# Patient Record
Sex: Male | Born: 1975 | Race: White | Hispanic: No | Marital: Married
Health system: Southern US, Academic
[De-identification: ages and names within clinical notes are randomized; demographics above are authoritative.]

---

## 2006-08-31 ENCOUNTER — Emergency Department (HOSPITAL_COMMUNITY): Payer: Self-pay | Admitting: EXTERNAL

## 2022-04-01 ENCOUNTER — Other Ambulatory Visit (HOSPITAL_COMMUNITY): Payer: Self-pay | Admitting: Neurological Surgery

## 2022-04-01 ENCOUNTER — Inpatient Hospital Stay
Admission: RE | Admit: 2022-04-01 | Discharge: 2022-04-01 | Disposition: A | Discharge status: Home / Self Care | Payer: Worker's Comp, Other unspecified | Source: Ambulatory Visit | Attending: Neurological Surgery | Admitting: Neurological Surgery

## 2022-04-01 ENCOUNTER — Other Ambulatory Visit: Payer: Self-pay

## 2022-04-01 DIAGNOSIS — S22009A Unspecified fracture of unspecified thoracic vertebra, initial encounter for closed fracture: Secondary | ICD-10-CM | POA: Insufficient documentation

## 2022-08-23 IMAGING — MR MRI THORACIC SPINE WITHOUT CONTRAST
4 of 5 series · 27 of 48 positions shown · non-contrast
Comparison: None available.

﻿EXAM:  [DATE]   MRI THORACIC SPINE WITHOUT CONTRAST,MRI LUMBAR SPINE WITHOUT CONTRAST
INDICATION: Low/mid back pain.
TECHNIQUE: Noncontrast multiplanar, multisequence MRI was performed.

[Series 5: T2 · sagittal · 4.0mm · 0.78mm/px · 7 of 13 slices shown (1 of 2)]
[im 1/13]
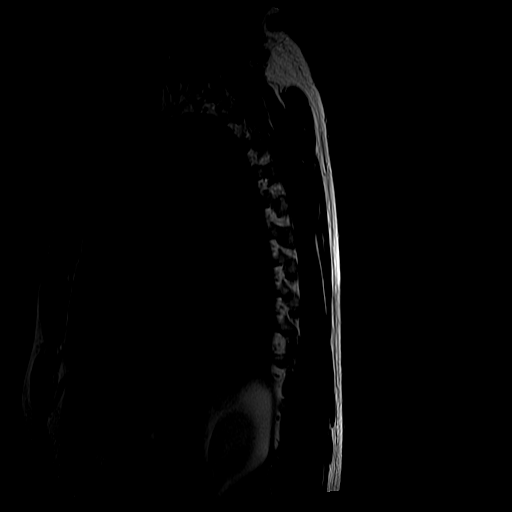
[im 3/13]
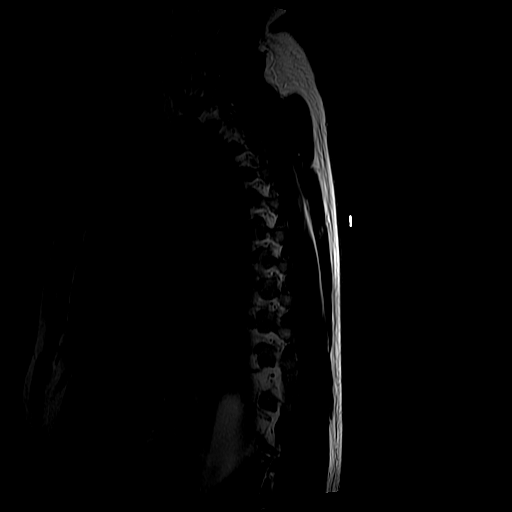
[im 5/13]
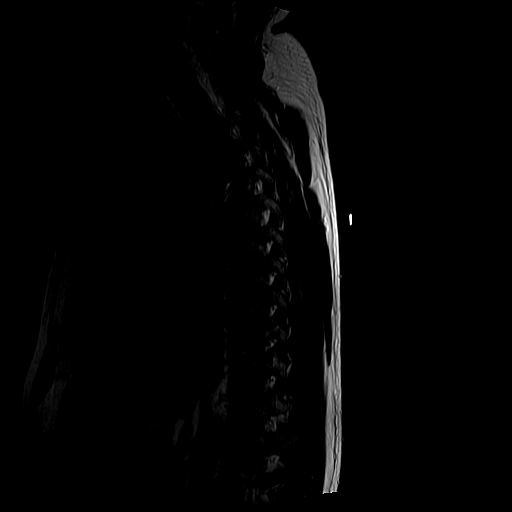
[im 7/13]
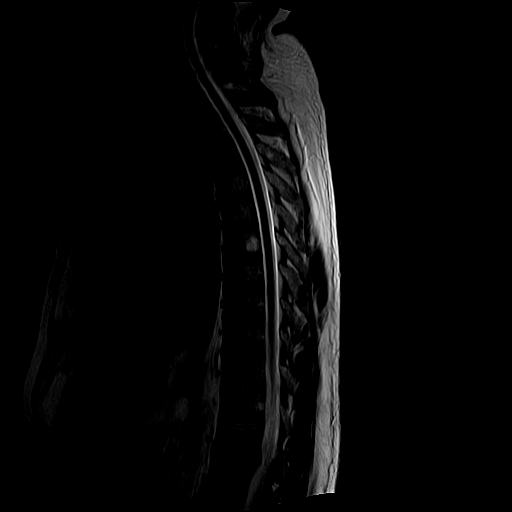
[im 9/13]
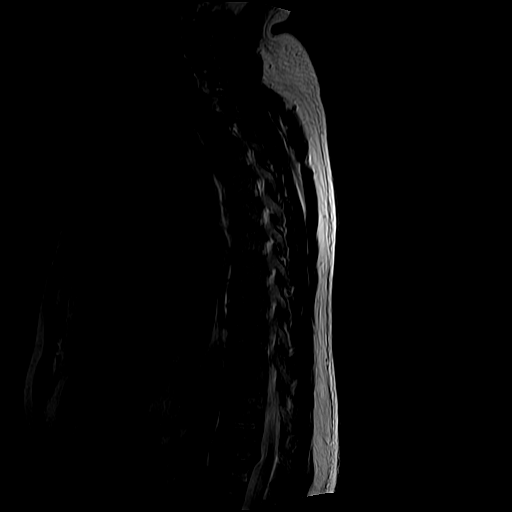
[im 11/13]
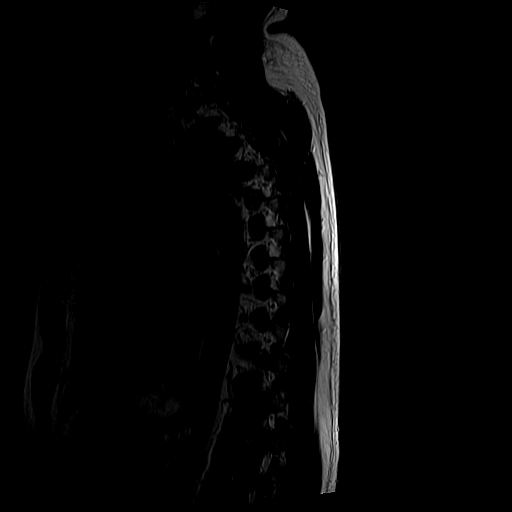
[im 13/13]
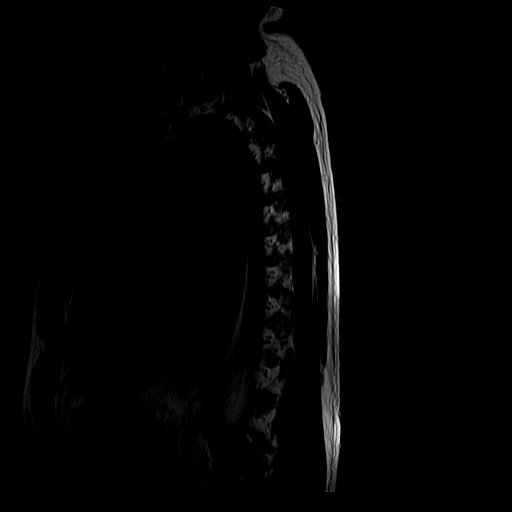

[Series 6: T1 · sagittal · 4.0mm · 0.78mm/px · 7 of 13 slices shown (1 of 2)]
[im 1/13]
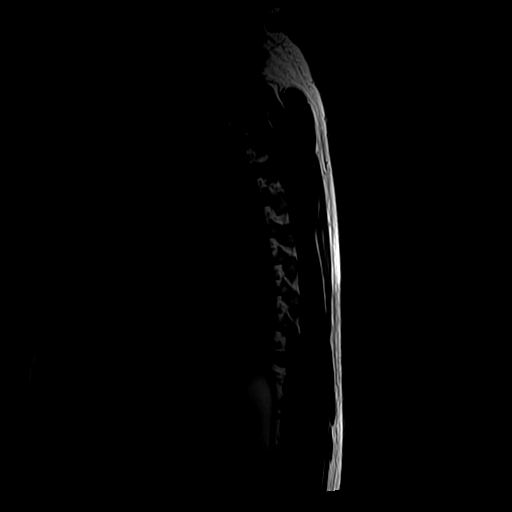
[im 3/13]
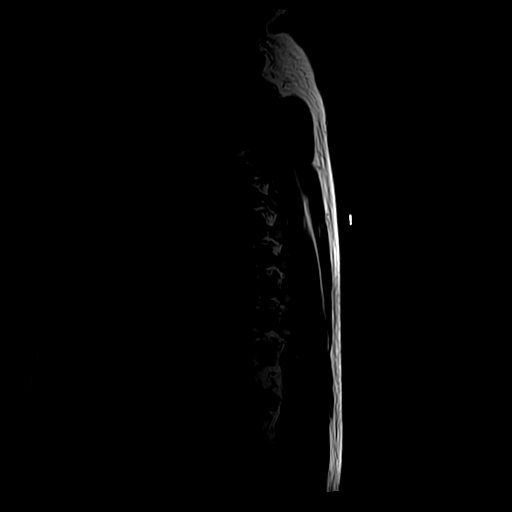
[im 5/13]
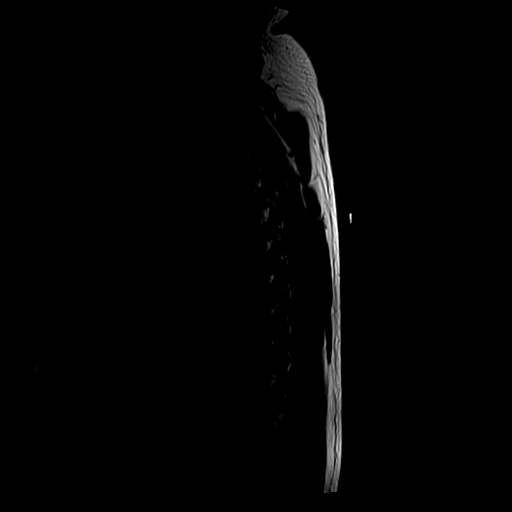
[im 7/13]
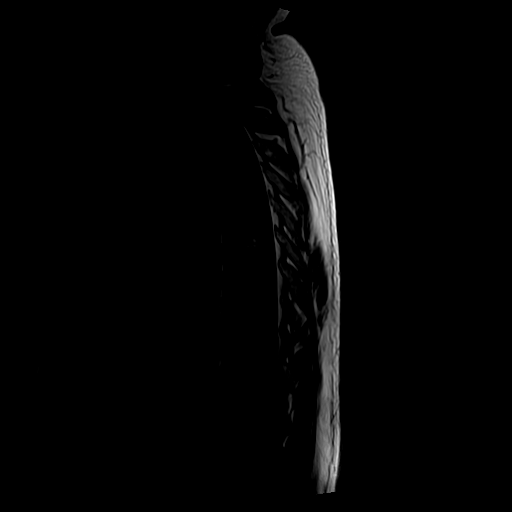
[im 9/13]
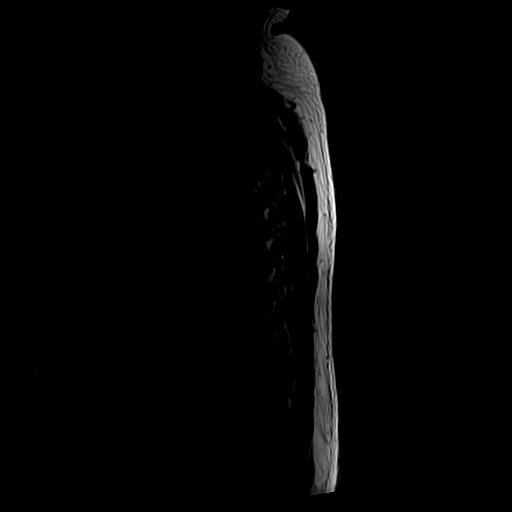
[im 11/13]
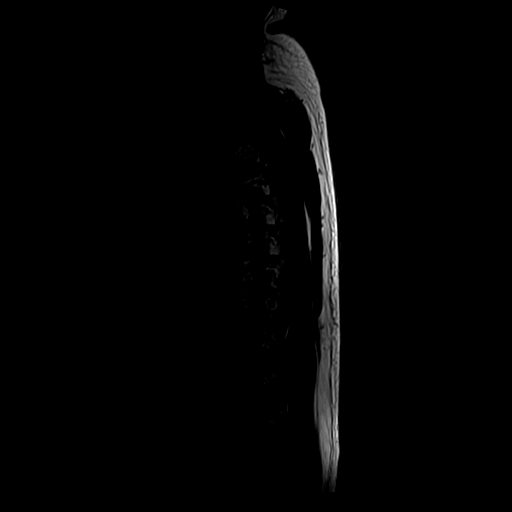
[im 13/13]
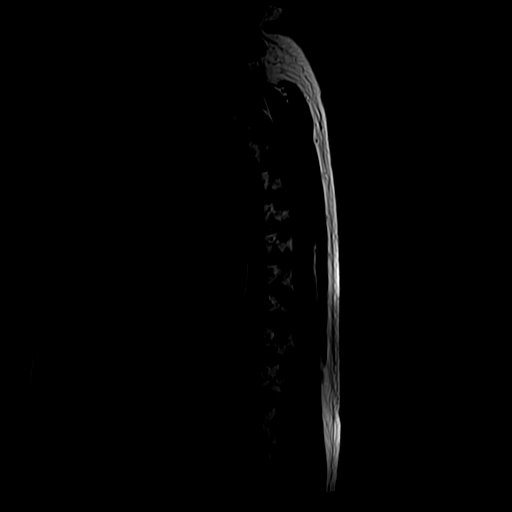

[Series 8: T2 · axial · 4.0mm · 0.62mm/px · z∈[-230,+6]mm · 9 of 22 slices shown (2 of 2)]
[im 1/22]
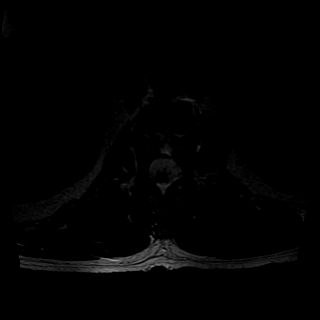
[im 4/22]
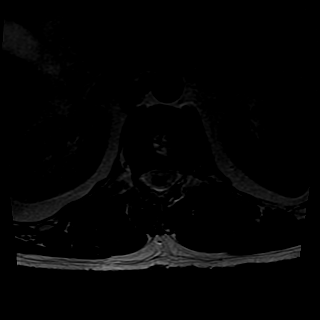
[im 8/22]
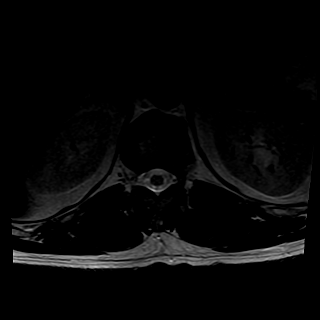
[im 9/22]
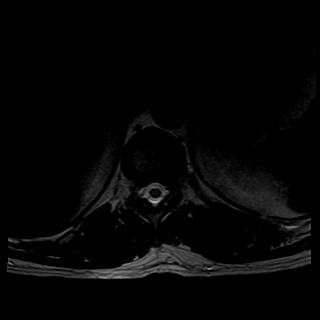
[im 11/22]
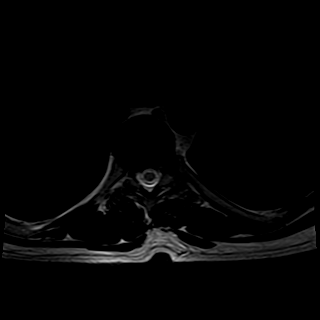
[im 13/22]
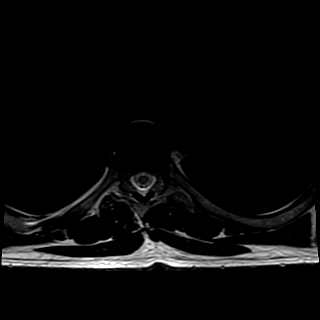
[im 15/22]
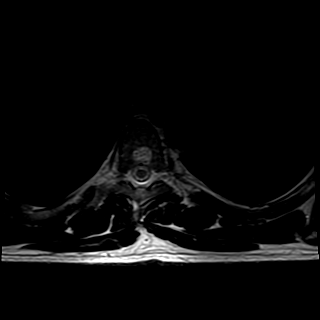
[im 18/22]
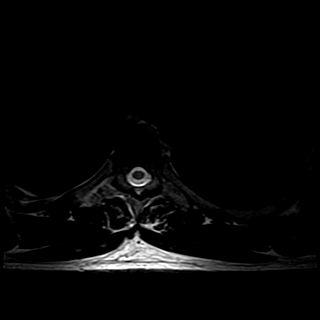
[im 22/22]
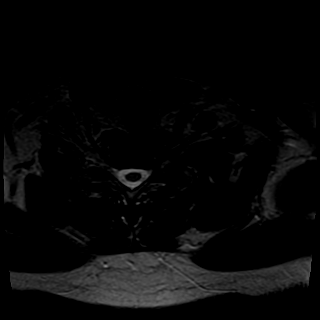

[Series 9: T1 · axial · 4.0mm · 0.62mm/px · z∈[-230,-38]mm · 4 of 22 slices shown (2 of 2)]
[im 1/22]
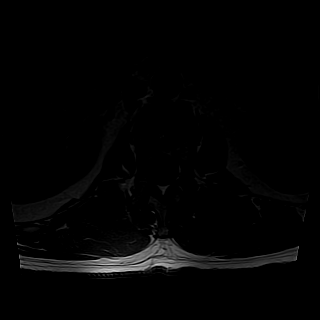
[im 4/22]
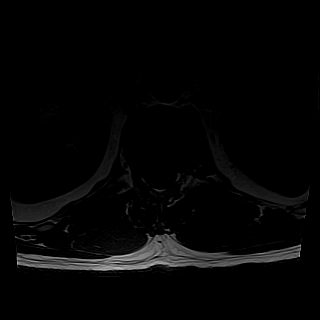
[im 11/22]
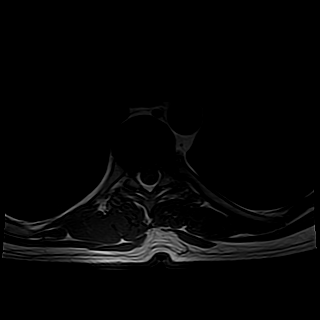
[im 18/22]
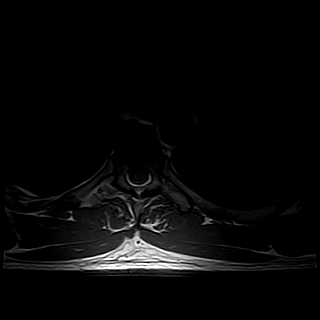

[27 of 48 positions shown; findings below may reference images not displayed]

FINDINGS: THORACIC SPINE:  There is an old T12 compression fracture.  There is mild retropulsion with slight compression of the thecal sac but no apparent spinal cord compression. 

The remaining visualized thoracic spine appears unremarkable.  A small hemangioma is noted within the T6 vertebral body.  There is no acute fracture, malalignment, significant marrow signal alteration, disc herniation, spinal stenosis, or abnormality of the spinal cord.  

LUMBAR SPINE:  There is mild degenerative disc disease at L4-L5 with mild disc bulging.  

The remaining visualized lumbar spine appears unremarkable.  There is no acute fracture, malalignment, significant marrow signal alteration, disc herniation, significant spinal stenosis, or abnormality of the conus.
IMPRESSION: 1. Old T12 compression fracture. 

2. Mild degenerative disc disease with mild disc bulging at L4-L5.

## 2022-08-23 IMAGING — MR MRI LUMBAR SPINE WITHOUT CONTRAST
5 of 6 series · 32 of 48 positions shown · non-contrast
Comparison: None available.

﻿EXAM:  [DATE]   MRI THORACIC SPINE WITHOUT CONTRAST,MRI LUMBAR SPINE WITHOUT CONTRAST
INDICATION: Low/mid back pain.
TECHNIQUE: Noncontrast multiplanar, multisequence MRI was performed.

[Series 5: T2 · sagittal · 4.0mm · 0.94mm/px · 6 of 13 slices shown (1 of 3)]
[im 1/13]
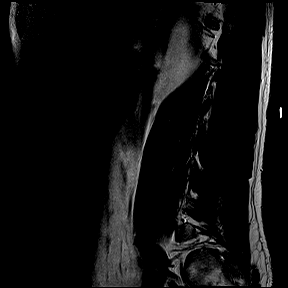
[im 3/13]
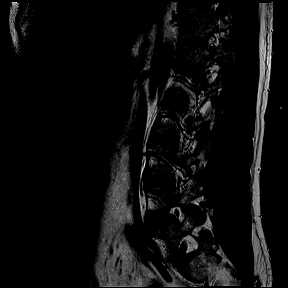
[im 5/13]
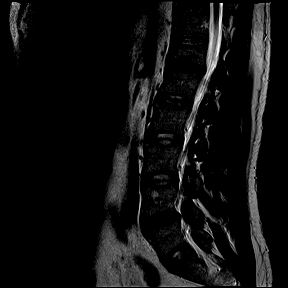
[im 8/13]
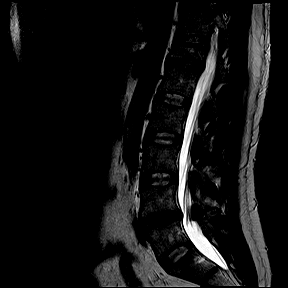
[im 10/13]
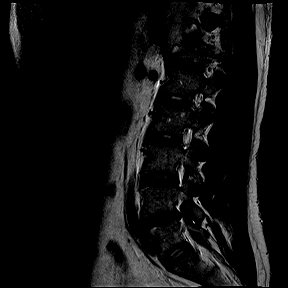
[im 13/13]
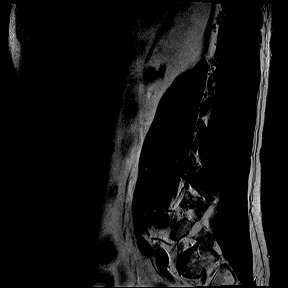

[Series 6: T1 · sagittal · 4.0mm · 0.94mm/px · 6 of 13 slices shown (1 of 2)]
[im 1/13]
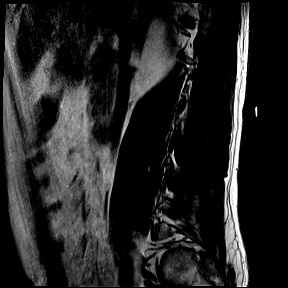
[im 3/13]
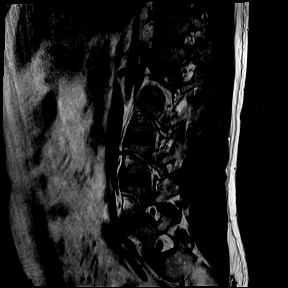
[im 5/13]
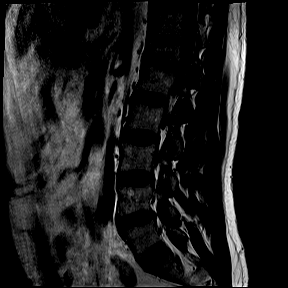
[im 8/13]
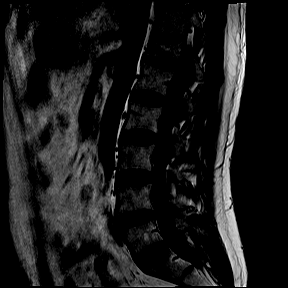
[im 10/13]
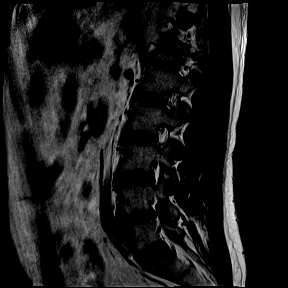
[im 13/13]
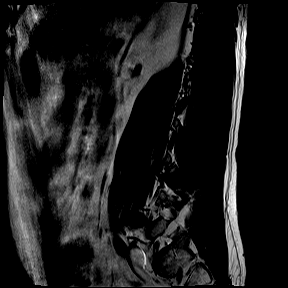

[Series 8: T2 · coronal · 5.0mm · 0.82mm/px · 8 of 18 slices shown (2 of 3)]
[im 1/18]
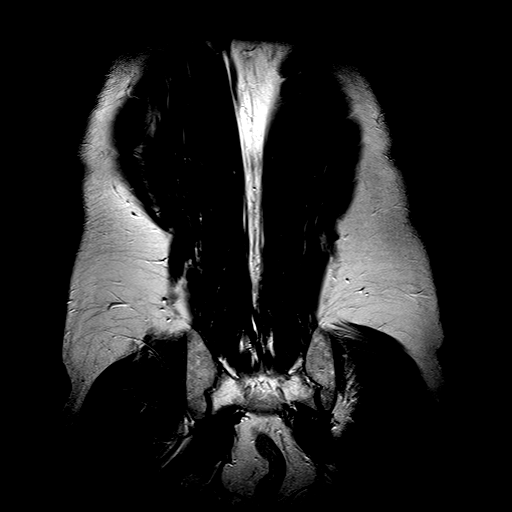
[im 3/18]
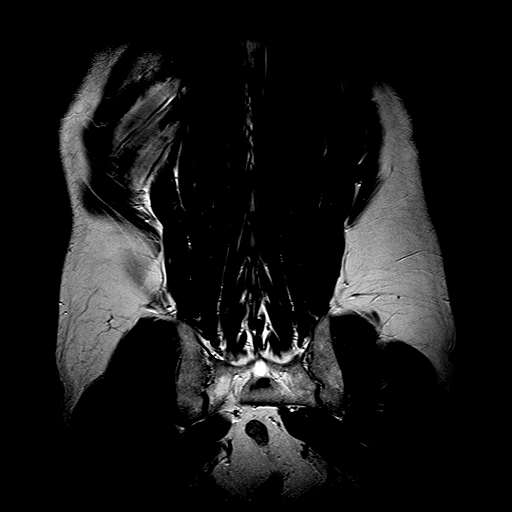
[im 5/18]
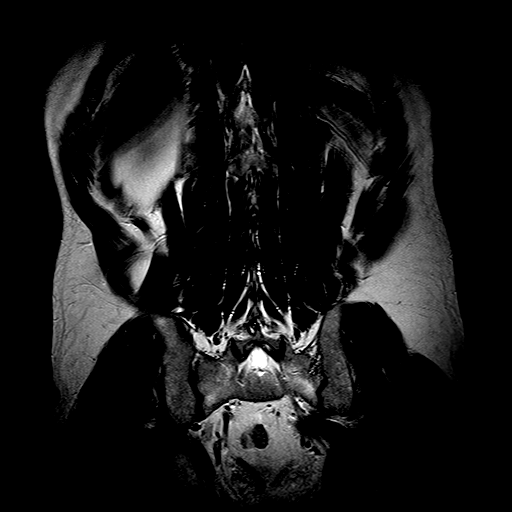
[im 8/18]
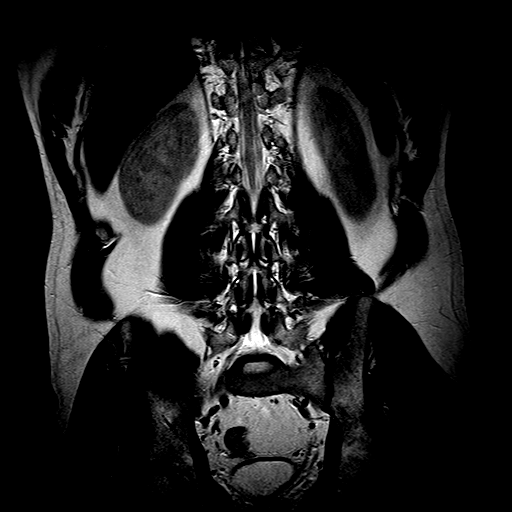
[im 10/18]
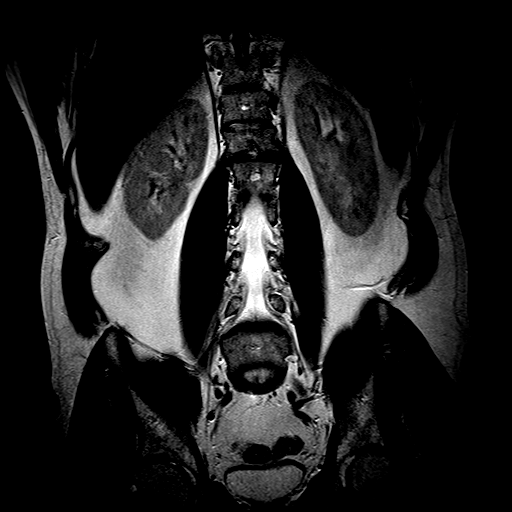
[im 13/18]
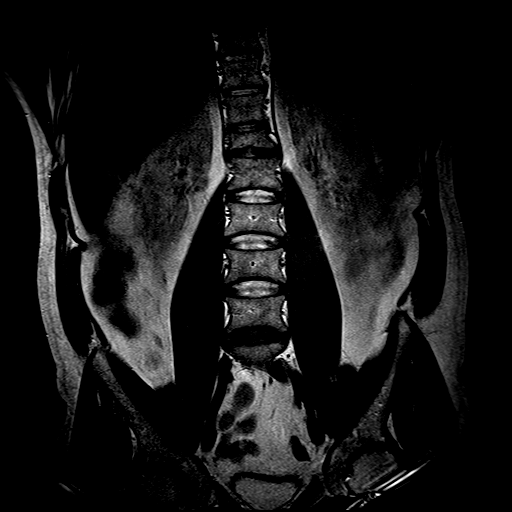
[im 15/18]
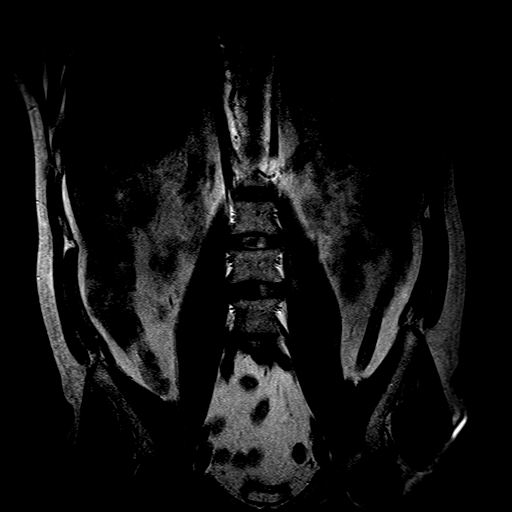
[im 18/18]
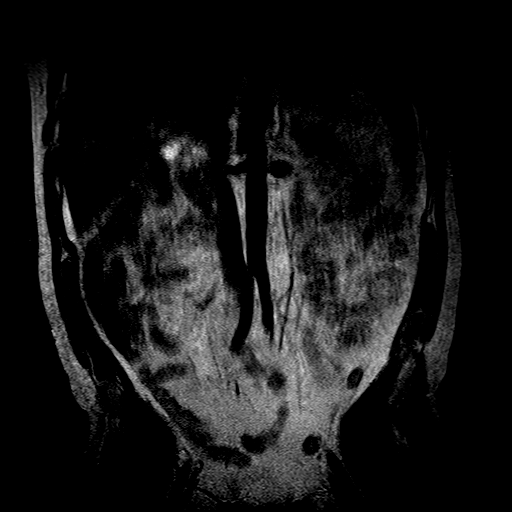

[Series 9: T2 · axial · 4.0mm · 0.52mm/px · z∈[-137,+111]mm · 11 of 26 slices shown (3 of 3)]
[im 1/26]
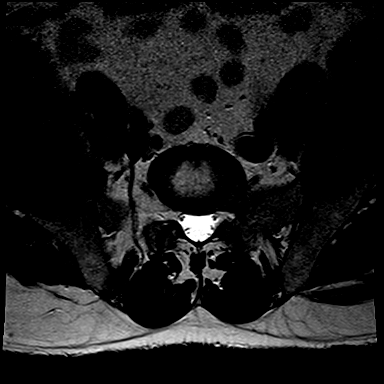
[im 3/26]
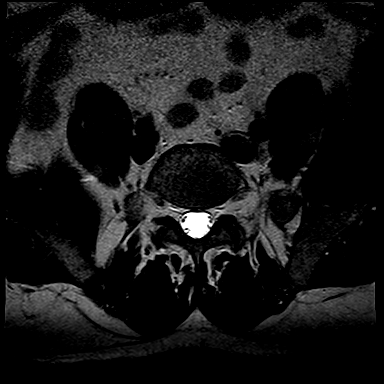
[im 6/26]
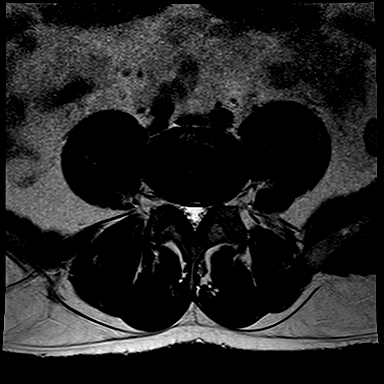
[im 8/26]
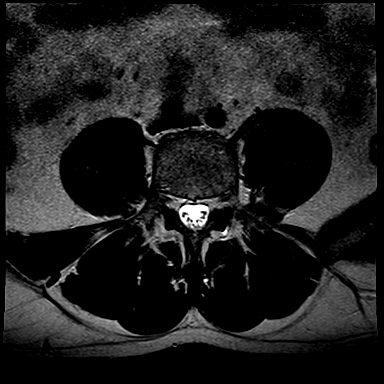
[im 11/26]
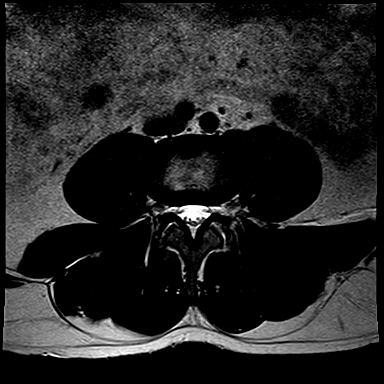
[im 13/26]
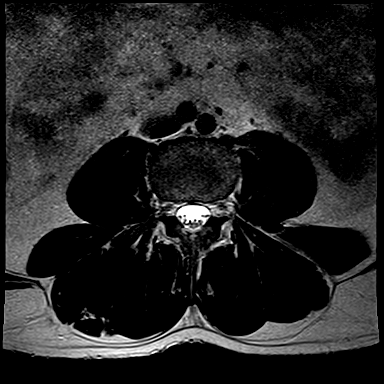
[im 16/26]
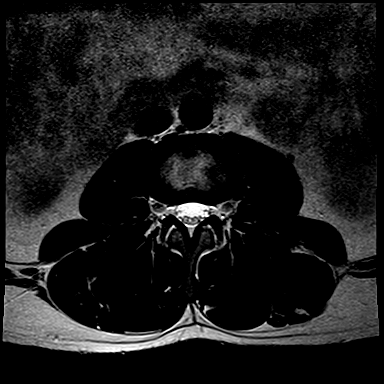
[im 18/26]
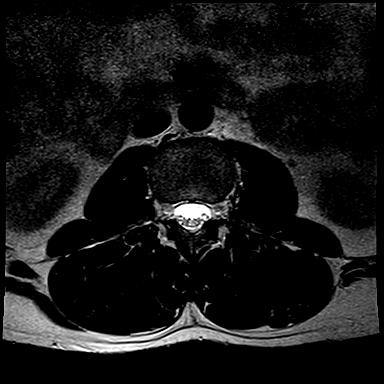
[im 21/26]
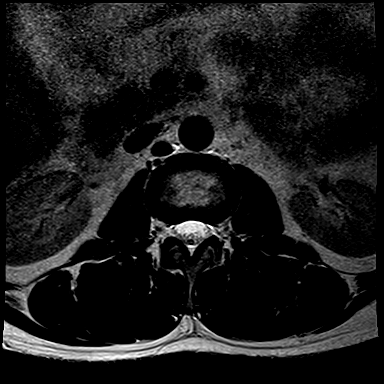
[im 23/26]
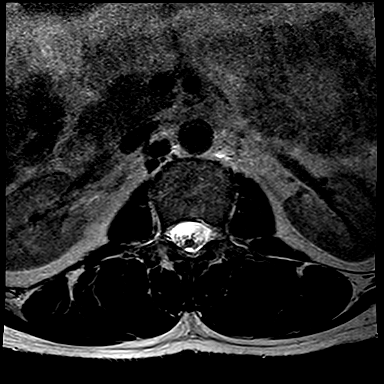
[im 26/26]
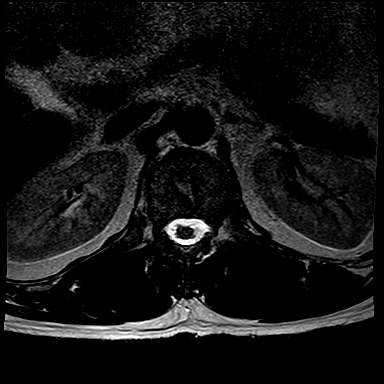

[Series 10: T1 · axial · 4.0mm · 0.52mm/px · 1 of 26 slices shown (2 of 2)]
[im 1/26]
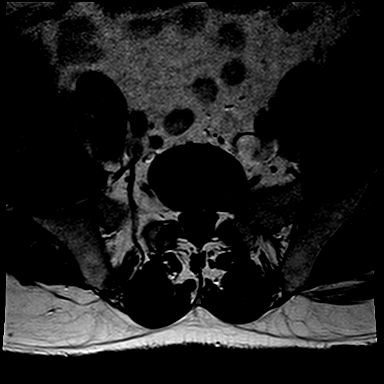

[32 of 48 positions shown; findings below may reference images not displayed]

FINDINGS: THORACIC SPINE:  There is an old T12 compression fracture.  There is mild retropulsion with slight compression of the thecal sac but no apparent spinal cord compression. 

The remaining visualized thoracic spine appears unremarkable.  A small hemangioma is noted within the T6 vertebral body.  There is no acute fracture, malalignment, significant marrow signal alteration, disc herniation, spinal stenosis, or abnormality of the spinal cord.  

LUMBAR SPINE:  There is mild degenerative disc disease at L4-L5 with mild disc bulging.  

The remaining visualized lumbar spine appears unremarkable.  There is no acute fracture, malalignment, significant marrow signal alteration, disc herniation, significant spinal stenosis, or abnormality of the conus.
IMPRESSION: 1. Old T12 compression fracture. 

2. Mild degenerative disc disease with mild disc bulging at L4-L5.

## 2022-09-23 ENCOUNTER — Ambulatory Visit (HOSPITAL_COMMUNITY)
Admission: RE | Admit: 2022-09-23 | Discharge: 2022-09-23 | Disposition: A | Discharge status: Still In Hospital | Payer: Worker's Comp, Other unspecified | Source: Ambulatory Visit

## 2022-09-23 ENCOUNTER — Other Ambulatory Visit: Payer: Self-pay

## 2022-09-23 DIAGNOSIS — S22080D Wedge compression fracture of T11-T12 vertebra, subsequent encounter for fracture with routine healing: Secondary | ICD-10-CM | POA: Insufficient documentation

## 2022-09-23 DIAGNOSIS — M546 Pain in thoracic spine: Secondary | ICD-10-CM | POA: Insufficient documentation

## 2022-09-23 NOTE — PT Evaluation (Signed)
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 Since then he has had increasing mid thoracic pain.     Previous episodes/treatments: No prior PT     Medications for this problem:  Ibuprofen and flexeril     Diagnostic tests:   XR THORACIC SPINE 2 VIEW performed on 04/01/2022 12:51 PM  CLINICAL HISTORY: S22.009A: Unspecified fracture of unspecified thoracic vertebra, initial encounter for closed fracture (CMS HCC).  Unspecified fracture of unspecified thoracic vertebra, initial encounter for closed fracture   TECHNIQUE: 2 views of the thoracic spine.  COMPARISON: None.  FINDINGS:  Normal thoracic vertebral body heights.   Disc space heights are preserved.  Normal alignment.  Paraspinal soft tissues are unremarkable.  IMPRESSION:  NEGATIVE THORACIC SPINE X-RAYS.    Does report he had MRI done outside of [facility] hospital system, which showed something like a cyst and varicose veins along the T6 region. Patient was also noted to have T12 fracture, and bulging discs of the lumbar spine.     Patient goals: REDUCE PAIN, RETURN TO WORK, and NORMALIZE FUNCTION    Occupation:  Works in the mines    Next MD visit: October 03, 2022    Pain location: Central mid back                     Pain description: DULL    Pain frequency:  CONTINUOUS     Pain rating: Now 3   Best 3   Worst 8    Radiculopathy: Does report occasional right hip pain that shoots all the way down right leg.     Pain increases with: STAND, SIT, WALK, BENDING, and LIFTING           decreases with : HEAT, POSITION CHANGE, and  REST    Sensation: Numbness/tingling of the lumbar region     Weakness: No significant weakness     Sleep affected: Used to sleep right side, however unable to do that now without pain     Bowel/bladder problems: No    Subjective Functional Reports:    Sitting: LIMITED- APPROXIMATELY 30 MINUTES     Standing: LIMITED- APPROXIMATELY 10-15 MINUTES     Walking: LIMITED- Approximately 10-15 minutes     Lifting: LIMITED- Has been only able to tolerate light lifting around the house           OBJECTIVE    Patient-Specific Functional Score:    Problem Score   1. Bending over to pick things up 4   2. Standing/walking more than 10-15 min 4   3. Lifting at least 100# for work  2   Total 3.3       AROM   right left   Flexion 50    Extension 8    Sidebend 10 7   Rotation 50% seated  75% Seated     ROM comments Noted some increased pain with extension and stiffness noted with right rotation. Flexion/extension  measured in standing and rotation in seated position.     Strength     right left   Hip flexion (L1,2) 5 5   Hip extension (L5,S1,2) NT NT   Knee flexion (S1) 5 5   Knee extension (L2-4) 5 5   Ankle DF (L4-5) 5 5       Palpation: Moderate tenderness along the T6-T8 and T12-L5 region along the central processes of the spine. Minimal tenderness noted along either side.     Sensation: Intact bilaterally     Posture: FORWARD HEAD    Gait: NO ASSISTIVE DEVICE, RECIPROCATING STEPS WITH SYMMETRIC STRIDE LENGTH, and NORMAL BOS      Treatment provided:REVIEW OF POC AND GOALS WITH PATIENT, ALL QUESTIONS ANSWERED, PATIENT EDUCATION, and THERAPEUTIC EXERCISE   Access Code: [email or web address]  URL: [email or web address]  Date: 09/23/2022  Prepared by: Cory Mcdaniel  Exercises  - Sidelying Thoracic Rotation with Open Book  - 2 x daily - 7 x weekly - 1 sets - 10 reps  - Seated Thoracic Lumbar Extension  - 1 x daily - 7 x weekly - 1 sets - 10 reps  - Cat Cow to Child's Pose  - 2 x daily - 7 x weekly - 1 sets - 10 reps  - Quadruped Thoracic  Rotation - Reach Under  - 2 x daily - 7 x weekly - 1 sets - 10 reps    Patient tolerated initial HEP with good tolerance and no increase in pain.             ASSESSMENT    Impression: Cory Mcdaniel is a 46 year old male who presents with ongoing mid thoracic pain as well as some radicular symptoms from lumbar to right posterior hip pain. Patient does demonstrate limitations in both thoracic and lumbar mobility, most noted with right rotation and extension. Myotomes and dermatomes are intact bilaterally in lower extremities. He will benefit from skilled PT services in order to safely progress with mobility and strengthening exercises in order to return back to work duties without risk of re-injury as well as to assist with reducing pain and normalizing function.     Rehab potential: GOOD      Short Term Goals: 3 Weeks   -Intermittent vs. constant pain. Worst SPS rating less than 5/10.   -NWB spinal mobility and strength WFL to allow pain-free bed mobility.  -Will improve seated and standing thoracic/lumbar ROM into normal limits with no increase in pain to improve functional mobility.    -Improve PSFS to 6/10 or greater to demonstrate increased activity tolerance and functional mobility.    -Will demonstrate independence and compliance with home exercise program.       Long Term Goals: 6 Weeks   -Abolish radicular symptoms. Worst SPS rating less than 3/10 or less.   -Sleep not disrupted by back pan.   -ROM / Strength WFL to permit normal body mechanics with all mobility.   -Community ambulation not limited by back pain.   -Improve PSFS to 8/10 or greater to demonstrate increased activity tolerance.    -Patient will demonstrate good body mechanics and lifting techniques 50# or greater with no increase in pain.      PLAN  Patient will attend 2 times per week x 6 weeks. Therapy may include, but is not limited to THERAPEUTIC EXERCISES, MYOFASCIAL/JOINT MOBILIZATION, POSTURE/BODY MECHANICS, ERGONOMIC TRAINING, TRANSFER/GAIT  TRAINING, HOME INSTRUCTIONS, HEAT/COLD, ULTRASOUND, ELECTRICAL STIMULATION, and NEURO RE-EDUCATIOIN    Plan for next visit Review HEP, if  able begin working on core/trunk stability activities as well as assessing proper lifting techniques to reduce strain on spine.        Evaluation complexity:   Personal factors impacting POC: OCCUPATIONAL ADLS (IE HEAVY LIFTING, REPETITIVE TASKS, LONG HOURS)   Co-morbidities impacting POC:  Leukemia 1990, black lung 2018, infusa port 1990  Complexity of physical exam: INCLUDING MUSCULOSKELETAL SYSTEM (POSTURE, ROM, STRENGTH, HEIGHT/WEIGHT)   Clinical Presentation: STABLE   Evaluation Complexity: LOW-HISTORY 0, EXAMINATION 1-2, STABLE PRESENTATION        Total Session Time 40, Timed code minutes 10, and Untimed code minutes 30        Intervention minutes: EVALUATION 30 minutes and THERAPEUTIC EXERCISE 10 minutes    William Hamburger, PT  09/23/2022, 08:04      Start of Service: _________          Certification:    From:______  Through:_________    I certify the need for these services furnished under this plan of treatment and while under my care.    Referring Provider Signature: _______________     Date : _____________________

## 2022-09-26 ENCOUNTER — Other Ambulatory Visit: Payer: Self-pay

## 2022-09-26 ENCOUNTER — Ambulatory Visit (HOSPITAL_COMMUNITY)
Admission: RE | Admit: 2022-09-26 | Discharge: 2022-09-26 | Disposition: A | Discharge status: Still In Hospital | Payer: Worker's Comp, Other unspecified | Source: Ambulatory Visit

## 2022-09-26 NOTE — PT Treatment (Signed)
[facility]  Outpatient Physical Therapy  [address]  [place], [address]  [phone]  (Fax) [phone]    Physical Therapy Treatment Note    Date: 09/26/2022  Patient's Name: Cory Mcdaniel  Date of Birth: [birth year]            Visit #/POC:2 of 12   Authorization:med nec  POC Signed?: no  POC Ends: 10/28/22  Next Progress Note Due: 11/2      Evaluating Physical Therapist: Louretta Parma, DPT   PT diagnosis/Reason for Referral: mid back pain, previous T12 fx  Next Scheduled Physician Appointment: TBA   Allergies/Contraindications: NKA          Subjective: Patient states he has increased pain with HEP of thread the needle, so he stopped. Patient is also doing workouts at home, keeping resistance light. Rates pain 4/10 upon arrival. "It's just in one spot on my back, and it feels like it's bone."    Objective: Patient is TTT and increased hypertrophy around T12. Exercises per flowsheet for stabilization and strengthening. Modified thoracic rotation (thread the needle) to sidelying open books, and patient was more tolerant. Progressed to more advanced stabilization exercises and tolerated well.       EXERCISE/ACTIVITY NAME REPETITIONS RESISTANCE COMPLETED THIS DOS   bridges 10  Y     Open books   10 ea  y   1/2 roll, wand flexion  1/2 roll, horizontal abd   10  10 Blue  Blue Y  Y     Seated PB extension  Seated PB SB   10  10      Y  y   [place], forearm   15 sec  y   superman   10  y   Egbert Garibaldi dog   37  y               *sent text, 09/26/22*    Access Code: [email or web address]  URL: [email or web address]  Date: 09/26/2022  Prepared by: Laurence Aly    Exercises  - Full Superman on Table  - 1 x daily - 7 x weekly - 3 sets - 10 reps  - Modified Superman on Table  - 1 x daily - 7 x weekly - 3 sets - 10 reps    Assessment: Good response to treatment with more advanced exercises. Patient has good body awareness and is self motivated. Did not assess goals today, as it was only his second  visit, with evaluation being 3 days ago.    Short Term Goals: 3 Weeks              -Intermittent vs. constant pain. Worst SPS rating less than 5/10.              -NWB spinal mobility and strength WFL to allow pain-free bed mobility.  -Will improve seated and standing thoracic/lumbar ROM into normal limits with no increase in pain to improve functional mobility.               -Improve PSFS to 6/10 or greater to demonstrate increased activity tolerance and functional mobility.               -Will demonstrate independence and compliance with home exercise program.         Long Term Goals: 6 Weeks              -Abolish radicular symptoms. Worst SPS rating less than 3/10 or less.              -  Sleep not disrupted by back pan.              -ROM / Strength WFL to permit normal body mechanics with all mobility.              -Community ambulation not limited by back pain.              -Improve PSFS to 8/10 or greater to demonstrate increased activity tolerance.               -Patient will demonstrate good body mechanics and lifting techniques 50# or greater with no increase in pain.      Plan: Monitor tolerance of exercises. Progress accordingly.    Total Session Time 30, Timed code minutes 30, and Untimed code minutes 0  THERAPEUTIC EXERCISE 30 minutes      Elinor Parkinson, PTA  09/26/2022, 08:48

## 2022-09-30 ENCOUNTER — Other Ambulatory Visit: Payer: Self-pay

## 2022-09-30 ENCOUNTER — Ambulatory Visit (HOSPITAL_COMMUNITY)
Admission: RE | Admit: 2022-09-30 | Discharge: 2022-09-30 | Disposition: A | Discharge status: Still In Hospital | Payer: Worker's Comp, Other unspecified | Source: Ambulatory Visit

## 2022-09-30 NOTE — PT Treatment (Signed)
[facility]  Outpatient Physical Therapy  [place], [address]  [phone]  [phone]    Physical Therapy Treatment Note    Date: 09/30/2022  Patient's Name: Cory Mcdaniel  Date of Birth: 1976-02-08            Visit #/POC:3 of 61   Authorization:med nec  POC Signed?: no  POC Ends: 10/28/22  Next Progress Note Due: 11/2      Evaluating Physical Therapist: Jolene Schimke, DPT   PT diagnosis/Reason for Referral: mid back pain, previous T12 fx  Next Scheduled Physician Appointment: TBA   Allergies/Contraindications: NKA          Subjective: Open book stretches are much better than the thread the needle. Notes that he did well with progression of exercises last visit. Still waking up at night every time he moves.    Objective: There ex per flow sheet below for mid back strengthening and flexibility.    EXERCISE/ACTIVITY NAME REPETITIONS RESISTANCE COMPLETED THIS DOS   bridges 10  Y     Open books   10 ea  y   1/2 roll, wand flexion  1/2 roll, horizontal abd   10  10 Blue  Blue Y  Y     Seated PB extension  Seated PB SB  Seated rotation '10  10  10    '$ Y  y   [place], forearm   15 sec  y   superman   10  y   Marina Gravel dog   74  y   Chest stretch over 1/2 roll 30 seconds x 2  Y         *sent text, 09/26/22*    Access Code: [email or web address]  URL: [email or web address]  Date: 09/26/2022  Prepared by: Elinor Parkinson    Exercises  - Full Superman on Table  - 1 x daily - 7 x weekly - 3 sets - 10 reps  - Modified Superman on Table  - 1 x daily - 7 x weekly - 3 sets - 10 reps    Assessment: Pt has met 2 STGs and is making progress toward others. Pt responded well to progression of strengthening exercises with only soreness noted.    Short Term Goals: 3 Weeks              -Intermittent vs. constant pain. Worst SPS rating less than 5/10. (MET 10/9 worst 4-5/10)              -NWB spinal mobility and strength WFL to allow pain-free bed mobility. (NOT MET)  -Will improve seated and  standing thoracic/lumbar ROM into normal limits with no increase in pain to improve functional mobility.               -Improve PSFS to 6/10 or greater to demonstrate increased activity tolerance and functional mobility.               -Will demonstrate independence and compliance with home exercise program. (MET 10/9)        Long Term Goals: 6 Weeks              -Abolish radicular symptoms. Worst SPS rating less than 3/10 or less.              -Sleep not disrupted by back pan.              -ROM / Strength WFL to permit normal body mechanics with all mobility.              -  Community ambulation not limited by back pain.              -Improve PSFS to 8/10 or greater to demonstrate increased activity tolerance.               -Patient will demonstrate good body mechanics and lifting techniques 50# or greater with no increase in pain.      Plan: Monitor tolerance of exercises. Progress accordingly.    Total Session Time 30, Timed code minutes 30, and Untimed code minutes 0  THERAPEUTIC EXERCISE 30 minutes    Brilynn Biasi, PTA 09/30/2022 08:37

## 2022-10-02 ENCOUNTER — Other Ambulatory Visit: Payer: Self-pay

## 2022-10-02 ENCOUNTER — Ambulatory Visit (HOSPITAL_COMMUNITY)
Admission: RE | Admit: 2022-10-02 | Discharge: 2022-10-02 | Disposition: A | Discharge status: Still In Hospital | Payer: Worker's Comp, Other unspecified | Source: Ambulatory Visit

## 2022-10-02 NOTE — PT Treatment (Signed)
[facility]  Outpatient Physical Therapy  [place], [address]  [phone]  [phone]    Physical Therapy Treatment Note    Date: 10/02/2022  Patient's Name: Cory Mcdaniel  Date of Birth: [birth year]            Visit #/POC:4 of 57   Authorization:med nec  POC Signed?: no  POC Ends: 10/28/22  Next Progress Note Due: 11/2      Evaluating Physical Therapist: Jolene Schimke, DPT   PT diagnosis/Reason for Referral: mid back pain, previous T12 fx  Next Scheduled Physician Appointment: TBA   Allergies/Contraindications: NKA          Subjective: Pt reports that he is a little sore this morning but no pain to report. Worst pain over the past couple days has been about a 4/10.     Objective: There ex per flow sheet below for mid back strengthening and flexibility.    EXERCISE/ACTIVITY NAME REPETITIONS RESISTANCE COMPLETED THIS DOS   bridges 10  Y     Open books   10 ea  y   1/2 roll, flexion  1/2 roll, horizontal abd   2 x 10  2 x 10 Blue  Blue Y  Y     Seated PB extension  Seated PB SB  Seated rotation '10  10  10    '$ Y  y   [place], forearm   15 sec  y   superman   10  y   Marina Gravel dog   10  y   Chest stretch over 1/2 roll 30 seconds x 3  Y   Seated thoracic rotation cervical neutral 10     Thoracic mobilizations - - Y   *sent text, 09/26/22*    Access Code: [email or web address]  URL: [email or web address]  Date: 09/26/2022  Prepared by: Elinor Parkinson    Exercises  - Full Superman on Table  - 1 x daily - 7 x weekly - 3 sets - 10 reps  - Modified Superman on Table  - 1 x daily - 7 x weekly - 3 sets - 10 reps    Assessment: Pt has met 2 STGs and is making progress toward others. Pt responded well to progression of strengthening exercises with only soreness noted.    Short Term Goals: 3 Weeks              -Intermittent vs. constant pain. Worst SPS rating less than 5/10. (MET 10/9 worst 4-5/10)              -NWB spinal mobility and strength WFL to allow pain-free bed mobility. (NOT  MET)  -Will improve seated and standing thoracic/lumbar ROM into normal limits with no increase in pain to improve functional mobility.               -Improve PSFS to 6/10 or greater to demonstrate increased activity tolerance and functional mobility.               -Will demonstrate independence and compliance with home exercise program. (MET 10/9)        Long Term Goals: 6 Weeks              -Abolish radicular symptoms. Worst SPS rating less than 3/10 or less.              -Sleep not disrupted by back pan.              -ROM /  Strength WFL to permit normal body mechanics with all mobility.              -Community ambulation not limited by back pain.              -Improve PSFS to 8/10 or greater to demonstrate increased activity tolerance.               -Patient will demonstrate good body mechanics and lifting techniques 50# or greater with no increase in pain.      Plan: Monitor tolerance of exercises. Progress accordingly.    Total Session Time 30, Timed code minutes 30, and Untimed code minutes 0  THERAPEUTIC EXERCISE 30 minutes    Evelynn Hench, PTA 10/02/2022 08:11

## 2022-10-07 ENCOUNTER — Other Ambulatory Visit: Payer: Self-pay

## 2022-10-07 ENCOUNTER — Ambulatory Visit (HOSPITAL_COMMUNITY)
Admission: RE | Admit: 2022-10-07 | Discharge: 2022-10-07 | Disposition: A | Discharge status: Still In Hospital | Payer: Worker's Comp, Other unspecified | Source: Ambulatory Visit

## 2022-10-07 NOTE — PT Treatment (Signed)
[facility]  Outpatient Physical Therapy  [place], [address]  [phone]  [phone]    Physical Therapy Treatment Note    Date: 10/07/2022  Patient's Name: Cory Mcdaniel  Date of Birth: June 17, 1976            Visit #/POC:5 of 37   Authorization:med nec  POC Signed?: no  POC Ends: 10/28/22  Next Progress Note Due: 11/2      Evaluating Physical Therapist: Jolene Schimke, DPT   PT diagnosis/Reason for Referral: mid back pain, previous T12 fx  Next Scheduled Physician Appointment: TBA   Allergies/Contraindications: NKA          Subjective: Pt reports that he is having very little pain this morning. States that he is now able to stand for about 1 hour. Walking does not bother his back. Feels like therapy has really helped and he is compliant with HEP as advised.    Objective: There ex per flow sheet below for mid back strengthening and flexibility.    Patient-Specific Functional Score:     Problem Score 10/07/22   1. Bending over to pick things up 4 9   2. Standing/walking more than 10-15 min 4 8   3. Lifting at least 100# for work  2 7   Total 3.3 8       EXERCISE/ACTIVITY NAME REPETITIONS RESISTANCE COMPLETED THIS DOS   bridges 10  N     Open books   10 ea  Y-HEP   1/2 roll, flexion  1/2 roll, horizontal abd   2 x 10  2 x 10 Blue  Blue N  N     Seated PB extension  Seated PB SB  Seated rotation '10  10  10    '$ Y  y   Plank, forearm   15 sec  y   superman   10  N- HEP   Bird dog   10  y   Chest stretch over 1/2 roll 30 seconds x 3  Y   Seated thoracic rotation cervical neutral 10  N-HEP   Thoracic mobilizations - - Y   *sent text, 09/26/22*    Access Code: [email or web address]  URL: [email or web address]  Date: 09/26/2022  Prepared by: Elinor Parkinson    Exercises  - Full Superman on Table  - 1 x daily - 7 x weekly - 3 sets - 10 reps  - Modified Superman on Table  - 1 x daily - 7 x weekly - 3 sets - 10 reps    Assessment: Thoracic mobility and postural strengthening  exercises were tolerated well. He has met 3 of 5 STGs and progressing toward the rest. He has met 3 LTGs and making good progress towards the others.    Short Term Goals: 3 Weeks              -Intermittent vs. constant pain. Worst SPS rating less than 5/10. (MET 10/9 worst 4-5/10)              -NWB spinal mobility and strength WFL to allow pain-free bed mobility. (Progressing)  -Will improve seated and standing thoracic/lumbar ROM into normal limits with no increase in pain to improve functional mobility. (Progressing 10/16)              -Improve PSFS to 6/10 or greater to demonstrate increased activity tolerance and functional mobility. (MET 10/16)              -  Will demonstrate independence and compliance with home exercise program. (MET 10/9)        Long Term Goals: 6 Weeks              -Abolish radicular symptoms. Worst SPS rating less than 3/10 or less.              -Sleep not disrupted by back pan.              -ROM / Strength WFL to permit normal body mechanics with all mobility.              -Community ambulation not limited by back pain. (MET 10/16)              -Improve PSFS to 8/10 or greater to demonstrate increased activity tolerance. (MET 10/16)              -Patient will demonstrate good body mechanics and lifting techniques 50# or greater with no increase in pain.  (MET 10/16)    Plan: Monitor tolerance of exercises. Progress accordingly.    Total Session Time 30, Timed code minutes 30, and Untimed code minutes 0  THERAPEUTIC EXERCISE 30 minutes    Jadriel Saxer, PTA 10/07/2022 08:15

## 2022-10-09 ENCOUNTER — Other Ambulatory Visit: Payer: Self-pay

## 2022-10-09 ENCOUNTER — Ambulatory Visit (HOSPITAL_COMMUNITY)
Admission: RE | Admit: 2022-10-09 | Discharge: 2022-10-09 | Disposition: A | Discharge status: Still In Hospital | Payer: Worker's Comp, Other unspecified | Source: Ambulatory Visit

## 2022-10-09 NOTE — PT Treatment (Signed)
[facility]  Outpatient Physical Therapy  [place], [address]  [phone]  [phone]    Physical Therapy Treatment Note    Date: 10/09/2022  Patient's Name: Cory Mcdaniel  Date of Birth: May 16, 1976      Visit #/POC: 6 of 38     Authorization:med nec  POC Signed?: no  POC Ends: 10/28/22  Next Progress Note Due: 11/2        Evaluating Physical Therapist: Jolene Schimke, DPT          PT diagnosis/Reason for Referral: mid back pain, previous T12 fx  Next Scheduled Physician Appointment: TBA        Allergies/Contraindications: NKA     Subjective: Report mild soreness in the mid to lower back, reports being about 50% back to normal function.      Objective: There ex per flow sheet below for mid back strengthening and flexibility.     Patient-Specific Functional Score:     Problem Score 10/07/22   1. Bending over to pick things up 4 9   2. Standing/walking more than 10-15 min 4 8   3. Lifting at least 100# for work  2 7   Total 3.3 8         EXERCISE/ACTIVITY NAME REPETITIONS RESISTANCE COMPLETED THIS DOS   bridges 10   N      Open books    10 ea   N-HEP   1/2 roll, flexion  1/2 roll, horizontal abd    2 x 10  2 x 10 Blue  Blue N  N      Seated PB extension  Seated PB SB  Seated rotation 10  10  2x10       With sport tubing N  N  Y   Plank, forearm    15 sec   N   superman    10  On PB  y- HEP   Bird dog    2X10  2# on UE   3# on LE  y   Chest stretch over 1/2 roll 30 seconds x 3   Y   Seated thoracic rotation cervical neutral 10   Y-HEP   Thoracic mobilizations - - n   Back Extension machine  2x15 50# Y   Row machine  2x15 50# Y   Monster walks  Lunges with trunk rotation  2 bouts   2 bouts  Green  Green          Assessment: Harbor is progressing well with goals and with increased activity tolerance with resistance functional strength training. He reports minimal to no increase in pain with activities today and is able to demonstrate improved postural form with  activities. He states at work he does have to be bent over for significant amount of time. Educated on making sure he is bending with knees and not just the back. Added exercises with trunk rotation today to work on improving proper form when he does have to bend over due to low clearances at work. Discussed that we will most likely look at discharge next week.      Short Term Goals: 3 Weeks              -Intermittent vs. constant pain. Worst SPS rating less than 5/10. (MET 10/9 worst 4-5/10)              -NWB spinal mobility and strength WFL to allow pain-free bed mobility. (  Progressing)  -Will improve seated and standing thoracic/lumbar ROM into normal limits with no increase in pain to improve functional mobility. (Progressing 10/16)              -Improve PSFS to 6/10 or greater to demonstrate increased activity tolerance and functional mobility. (MET 10/16)              -Will demonstrate independence and compliance with home exercise program. (MET 10/9)        Long Term Goals: 6 Weeks              -Abolish radicular symptoms. Worst SPS rating less than 3/10 or less.              -Sleep not disrupted by back pan.              -ROM / Strength WFL to permit normal body mechanics with all mobility.              -Community ambulation not limited by back pain. (MET 10/16)              -Improve PSFS to 8/10 or greater to demonstrate increased activity tolerance. (MET 10/16)              -Patient will demonstrate good body mechanics and lifting techniques 50# or greater with no increase in pain.  (MET 10/16)     Plan: Monitor tolerance of exercises. Progress accordingly with trunk, quad, glut strengthening to maximize return to work pain free.     Total Session Time 40 and Timed code minutes 40  THERAPEUTIC EXERCISE 40 minutes      William Hamburger, PT  10/09/2022, 08:53

## 2022-10-14 ENCOUNTER — Ambulatory Visit (HOSPITAL_COMMUNITY)
Admission: RE | Admit: 2022-10-14 | Discharge: 2022-10-14 | Disposition: A | Discharge status: Still In Hospital | Payer: Worker's Comp, Other unspecified | Source: Ambulatory Visit

## 2022-10-14 NOTE — PT Treatment (Signed)
[facility]  Outpatient Physical Therapy  [place], [address]  [phone]  [phone]    Physical Therapy Treatment Note    Date: 10/14/2022  Patient's Name: Cory Mcdaniel  Date of Birth: 1976-10-03      Visit #/POC: 7 of 6     Authorization:med nec  POC Signed?: no  POC Ends: 10/28/22  Next Progress Note Due: 11/2        Evaluating Physical Therapist: Jolene Schimke, DPT          PT diagnosis/Reason for Referral: mid back pain, previous T12 fx  Next Scheduled Physician Appointment: TBA        Allergies/Contraindications: NKA     Subjective: Reports that he was extremely sore after last session, actually went home and went to bed. However after about a day and half the soreness/pain was gone and it was back to baseline. Does feel like he is getting more mobility and the knotting in his back has also improved.    Objective: There ex per flow sheet below for mid back strengthening and flexibility.     Patient-Specific Functional Score:     Problem Score 10/07/22   1. Bending over to pick things up 4 9   2. Standing/walking more than 10-15 min 4 8   3. Lifting at least 100# for work  2 7   Total 3.3 8         EXERCISE/ACTIVITY NAME REPETITIONS RESISTANCE COMPLETED THIS DOS   bridges 10   N      Open books    10 ea   N-HEP   1/2 roll, flexion  1/2 roll, horizontal abd    2 x 10  2 x 10 Blue  Blue N  N      Seated PB extension  Seated PB SB  Seated rotation 10  10  2x10       With sport tubing N  N  Y   Plank, forearm    15 sec   N   superman    10  On PB  y- HEP   Bird dog    2X10  2# on UE   3# on LE  y   Chest stretch over 1/2 roll 30 seconds x 3   Y   Seated thoracic rotation cervical neutral 10   Y-HEP   Thoracic mobilizations - - n   Back Extension machine  2x15 50# Y   Row machine  2x15 50# Y   Monster walks  Lunges with trunk rotation  2 bouts   2 bouts  Green  Green          Assessment: Pt is progresssing toward long term goals. He has subjective  improvements to note during this session. Strengthening and mobility exercises were tolerated well with no complaints.   Short Term Goals: 3 Weeks              -Intermittent vs. constant pain. Worst SPS rating less than 5/10. (MET 10/9 worst 4-5/10)              -NWB spinal mobility and strength WFL to allow pain-free bed mobility. (Progressing)  -Will improve seated and standing thoracic/lumbar ROM into normal limits with no increase in pain to improve functional mobility. (Progressing 10/16)              -Improve PSFS to 6/10 or greater to demonstrate increased activity tolerance and functional  mobility. (MET 10/16)              -Will demonstrate independence and compliance with home exercise program. (MET 10/9)        Long Term Goals: 6 Weeks              -Abolish radicular symptoms. Worst SPS rating less than 3/10 or less.              -Sleep not disrupted by back pan.              -ROM / Strength WFL to permit normal body mechanics with all mobility.              -Community ambulation not limited by back pain. (MET 10/16)              -Improve PSFS to 8/10 or greater to demonstrate increased activity tolerance. (MET 10/16)              -Patient will demonstrate good body mechanics and lifting techniques 50# or greater with no increase in pain.  (MET 10/16)     Plan: Monitor tolerance of exercises. Progress accordingly with trunk, quad, glut strengthening to maximize return to work pain free.     Total Session Time 40 and Timed code minutes 40  THERAPEUTIC EXERCISE 40 minutes      Autie Vasudevan, PTA 10/14/2022 09:20

## 2022-10-16 ENCOUNTER — Ambulatory Visit (HOSPITAL_COMMUNITY)
Admission: RE | Admit: 2022-10-16 | Discharge: 2022-10-16 | Disposition: A | Discharge status: Still In Hospital | Payer: Worker's Comp, Other unspecified | Source: Ambulatory Visit

## 2022-10-16 ENCOUNTER — Other Ambulatory Visit: Payer: Self-pay

## 2022-10-16 NOTE — PT Treatment (Signed)
[facility]  Outpatient Physical Therapy  [place], [address]  [phone]  [phone]    Physical Therapy Treatment Note    Date: 10/16/2022  Patient's Name: Cory Mcdaniel  Date of Birth: [birth year]    Visit #/POC: 8 of 74     Authorization:med nec  POC Signed?: no  POC Ends: 10/28/22  Next Progress Note Due: 11/2        Evaluating Physical Therapist: Jolene Schimke, DPT          PT diagnosis/Reason for Referral: mid back pain, previous T12 fx  Next Scheduled Physician Appointment: TBA        Allergies/Contraindications: NKA     Subjective: Does report 3/10 between shoulder blades today. No significant increase in pain after last session.      Objective: There ex per flow sheet below for mid back strengthening and flexibility.     Patient-Specific Functional Score:     Problem Score 10/07/22   1. Bending over to pick things up 4 9   2. Standing/walking more than 10-15 min 4 8   3. Lifting at least 100# for work  2 7   Total 3.3 8      Standing AROM  65 degrees Flexion   12 degrees Extension  10 degrees side bending bilaterally   Right Rotation stiffness with about 75% range  Left Rotation WFL   EXERCISE/ACTIVITY NAME REPETITIONS RESISTANCE COMPLETED THIS DOS   bridges 10   N      Open books    10 ea   N-HEP   1/2 roll, flexion  1/2 roll, horizontal abd    2 x 10  2 x 10 Blue  Blue N  N      Seated PB extension  Seated PB SB  Seated rotation 10  10  2x10       With sport tubing N  N  Y   Plank, forearm    15 sec   N   superman    10  On PB  n- HEP   Bird dog    2X10  2# on UE   3# on LE  n   Chest stretch over 1/2 roll 30 seconds x 3   N   Seated thoracic rotation cervical neutral 10   Y-HEP   Thoracic mobilizations - - n   Back Extension machine  x25 50# Y   Row machine  2x15 60#-80# Y   Lat Pull Downs  x2x15 60# Y   Abdominal machine  2x15 50#    Monster walks  Lunges with trunk rotation  2 bouts   2 bouts  Ailene Ards      Thoracic PA  MFR rhomboids     Y    Access Code: [email or web address]  URL: [email or web address]  Date: 10/16/2022  Prepared by: Marcello Moores Bekah Igoe  Exercises  - Seated Rhomboid Stretch  - 2 x daily - 7 x weekly - 3 sets - 10 reps  - Doorway Rhomboid Stretch  - 1 x daily - 7 x weekly - 3 sets - 10 reps    Assessment: Patient does present with improvements of functional ROM, strength, and activity tolerance as noted with PSFS and objective findings. He is also progressing well with therapy goals. He continues to have some ongoing pain along the rhomboids and T6 region. Provided updated stretches to focus on rhomboids which did help relieve some symptoms. He is  also reporting ongoing stiffness and limitations with right thoracic rotation. He would benefit from ongoing skilled PT services to address progression of maximizing functional mobility without pain and return to full PLOF.     Short Term Goals: 3 Weeks              -Intermittent vs. constant pain. Worst SPS rating less than 5/10. (MET 10/9 worst 4-5/10)              -NWB spinal mobility and strength WFL to allow pain-free bed mobility. (Progressing)  -Will improve seated and standing thoracic/lumbar ROM into normal limits with no increase in pain to improve functional mobility. (Progressing 10/16)              -Improve PSFS to 6/10 or greater to demonstrate increased activity tolerance and functional mobility. (MET 10/16)              -Will demonstrate independence and compliance with home exercise program. (MET 10/9)        Long Term Goals: 6 Weeks              -Abolish radicular symptoms. Worst SPS rating less than 3/10 or less. progressing              -Sleep not disrupted by back pain. Progressing               -ROM / Strength WFL to permit normal body mechanics with all mobility.              -Community ambulation not limited by back pain. (MET 10/16)              -Improve PSFS to 8/10 or greater to demonstrate increased activity tolerance. (MET 10/16)              -Patient will demonstrate  good body mechanics and lifting techniques 50# or greater with no increase in pain.  (MET 10/16)     Plan: Monitor tolerance of exercises. Progress accordingly with trunk, quad, glut strengthening to maximize return to work pain free.     Total Session Time 40 and Timed code minutes 40  THERAPEUTIC EXERCISE 30 minutes and JOINT MOBILIZATION/MFR 10 minutes      [employer], PT  10/16/2022, 08:50

## 2022-10-21 ENCOUNTER — Other Ambulatory Visit: Payer: Self-pay

## 2022-10-21 ENCOUNTER — Ambulatory Visit
Admission: RE | Admit: 2022-10-21 | Discharge: 2022-10-21 | Disposition: A | Discharge status: Still In Hospital | Payer: Worker's Comp, Other unspecified | Source: Ambulatory Visit | Attending: FAMILY PRACTICE | Admitting: FAMILY PRACTICE

## 2022-10-21 NOTE — PT Treatment (Signed)
[facility]  Outpatient Physical Therapy  [place], [address]  [phone]  [phone]    Physical Therapy Treatment Note    Date: 10/21/2022  Patient's Name: Cory Mcdaniel  Date of Birth: 03-22-1976    Visit #/POC: 9 of 76     Authorization:med nec  POC Signed?: no  POC Ends: 10/28/22  Next Progress Note Due: 11/2        Evaluating Physical Therapist: Jolene Schimke, DPT          PT diagnosis/Reason for Referral: mid back pain, previous T12 fx  Next Scheduled Physician Appointment: TBA        Allergies/Contraindications: NKA     Subjective: Notes that the pain in the middle of his back today is about a 2/10.  Bed mobility is improving  Objective: There ex per flow sheet below for mid back strengthening and flexibility.     Patient-Specific Functional Score:     Problem Score 10/07/22   1. Bending over to pick things up 4 9   2. Standing/walking more than 10-15 min 4 8   3. Lifting at least 100# for work  2 7   Total 3.3 8      Standing AROM  65 degrees Flexion   12 degrees Extension  10 degrees side bending bilaterally   Right Rotation stiffness with about 75% range  Left Rotation WFL   EXERCISE/ACTIVITY NAME REPETITIONS RESISTANCE COMPLETED THIS DOS   bridges 10   N      Open books    10 ea   N-HEP   1/2 roll, flexion  1/2 roll, horizontal abd    2 x 10  2 x 10 Blue  Blue N  N      Seated PB extension  Seated PB SB  Seated rotation 10  10  2x10       With sport tubing N  N  Y   Plank, forearm    15 sec   N   superman    10  On PB  n- HEP   Bird dog    2X10  2# on UE   3# on LE  n   Chest stretch over 1/2 roll 30 seconds x 3   N   Seated thoracic rotation cervical neutral 10   Y-HEP   Thoracic mobilizations - - n   Back Extension machine  x30 50# Y   Row machine  3x15 60#-80# Y   Lat Pull Downs  2x15 60# Y   Abdominal machine  2x15 50#    Monster walks  Lunges with trunk rotation  2 bouts   2 bouts  Ailene Ards      Thoracic PA  MFR rhomboids    Y     Access Code: [email or web address]  URL: [email or web address]  Date: 10/16/2022  Prepared by: Marcello Moores Maggard  Exercises  - Seated Rhomboid Stretch  - 2 x daily - 7 x weekly - 3 sets - 10 reps  - Doorway Rhomboid Stretch  - 1 x daily - 7 x weekly - 3 sets - 10 reps    Assessment: Pt has met most goals for therapy, the one goal for bed mobility he is making good progress towards noting that it is getting better.     Short Term Goals: 3 Weeks              -Intermittent vs. constant pain. Worst SPS rating  less than 5/10. (MET 10/9 worst 4-5/10)              -NWB spinal mobility and strength WFL to allow pain-free bed mobility. (Progressing)  -Will improve seated and standing thoracic/lumbar ROM into normal limits with no increase in pain to improve functional mobility. (Progressing 10/16)              -Improve PSFS to 6/10 or greater to demonstrate increased activity tolerance and functional mobility. (MET 10/16)              -Will demonstrate independence and compliance with home exercise program. (MET 10/9)        Long Term Goals: 6 Weeks              -Abolish radicular symptoms. Worst SPS rating less than 3/10 or less. (MET 10/30)              -Sleep not disrupted by back pain. (MET 10/30)               -ROM / Strength WFL to permit normal body mechanics with all mobility.              -Community ambulation not limited by back pain. (MET 10/16)              -Improve PSFS to 8/10 or greater to demonstrate increased activity tolerance. (MET 10/16)              -Patient will demonstrate good body mechanics and lifting techniques 50# or greater with no increase in pain.  (MET 10/16)     Plan: Monitor tolerance of exercises. Next visit will reassess for discharge.    Total Session Time 30 and Timed code minutes 30  THERAPEUTIC EXERCISE 25 minutes and JOINT MOBILIZATION/MFR 5 minutes      Rufino Staup, PTA 10/21/2022 08:40

## 2022-10-23 ENCOUNTER — Other Ambulatory Visit: Payer: Self-pay

## 2022-10-23 ENCOUNTER — Ambulatory Visit
Admission: RE | Admit: 2022-10-23 | Discharge: 2022-10-23 | Disposition: A | Discharge status: Still In Hospital | Payer: Worker's Comp, Other unspecified | Source: Ambulatory Visit | Attending: FAMILY PRACTICE | Admitting: FAMILY PRACTICE

## 2022-10-23 DIAGNOSIS — M546 Pain in thoracic spine: Secondary | ICD-10-CM | POA: Insufficient documentation

## 2022-10-23 DIAGNOSIS — S22080D Wedge compression fracture of T11-T12 vertebra, subsequent encounter for fracture with routine healing: Secondary | ICD-10-CM | POA: Insufficient documentation

## 2022-10-23 NOTE — PT Treatment (Addendum)
[facility]  Outpatient Physical Therapy  [place], [address]  [phone]  [phone]    Physical Therapy Treatment Note    Date: 10/23/2022  Patient's Name: Cory Mcdaniel  Date of Birth: [birth year]    Visit #/POC: 39 of 83     Authorization:med nec  POC Signed?: no  POC Ends: 10/28/22  Next Progress Note Due: 11/2        Evaluating Physical Therapist: Jolene Schimke, DPT          PT diagnosis/Reason for Referral: mid back pain, previous T12 fx  Next Scheduled Physician Appointment: TBA        Allergies/Contraindications: NKA     Subjective: Rates his pain at 1/10. 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 Feels like he is ready at this point to continue his exercises at home.    Objective: There ex per flow sheet below for mid back strengthening and flexibility.     Patient-Specific Functional Score:   Problem Score 10/07/22 10/23/22   1. Bending over to pick things up _0 2. Standing/walking more than 10-15 min _1 3. Lifting at least 100# for work  _2 Total 3._3 Standing AROM  70 degrees Flexion   12 degrees Extension  15 degrees side bending bilaterally   Right Rotation stiffness with about 80% range  Left Rotation WFL   EXERCISE/ACTIVITY NAME REPETITIONS RESISTANCE COMPLETED THIS DOS   bridges 10   N      Open books    10 ea   N-HEP   1/2 roll, flexion  1/2 roll, horizontal abd    2 x 10  2 x 10 Blue  Blue N  N      Seated PB extension  Seated PB SB  Seated rotation 10  10  2x10       With sport tubing N  N  Y   Plank, forearm    15 sec   N   superman    10  On PB  n- HEP   Bird dog    2X10  2# on UE   3# on LE  n   Chest stretch over 1/2 roll 30 seconds x 3   N   Seated thoracic rotation cervical neutral 10   Y-HEP   Thoracic mobilizations - - n   Back Extension machine  x30 50# Y   Row machine  3x15 60#-80# Y   Lat Pull Downs  2x15 60# Y   Abdominal machine  2x15 50#    Monster walks  Lunges  with trunk rotation  2 bouts   2 bouts  Ailene Ards      Thoracic PA  MFR rhomboids    Y    Access Code: [email or web address]  URL: [email or web address]  Date: 10/16/2022  Prepared by: Marcello Moores Perle Brickhouse  Exercises  - Seated Rhomboid Stretch  - 2 x daily - 7 x weekly - 3 sets - 10 reps  - Doorway Rhomboid Stretch  - 1 x daily - 7 x weekly - 3 sets - 10 reps    Assessment: Pt has met goals for therapy and is Independent with HEP.  He is able to correctly lift 100# from floor to waist and waist to overhead.     From PT:  Agree with discharge from therapy services this date as he has progressed well with all goals and is able to safely demonstrate activities require for work and has been able to return to his regular workout routine. He still has times of minimal pain, however with the provided exercises he has been able to reduce and manage pain.     Short Term Goals: 3 Weeks              -Intermittent vs. constant pain. Worst SPS rating less than 5/10. (MET 10/9 worst 4-5/10)              -NWB spinal mobility and strength WFL to allow pain-free bed mobility. (Progressing)  -Will improve seated and standing thoracic/lumbar ROM into normal limits with no increase in pain to improve functional mobility. (MET 10/23/22)              -Improve PSFS to 6/10 or greater to demonstrate increased activity tolerance and functional mobility. (MET 10/16)              -Will demonstrate independence and compliance with home exercise program. (MET 10/9)        Long Term Goals: 6 Weeks              -Abolish radicular symptoms. Worst SPS rating less than 3/10 or less. (MET 10/30)              -Sleep not disrupted by back pain. (MET 10/30)               -ROM / Strength WFL to permit normal body mechanics with all mobility.              -Community ambulation not limited by back pain. (MET 10/16)              -Improve PSFS to 8/10 or greater to demonstrate increased activity tolerance. (MET 10/16)              -Patient will demonstrate good body  mechanics and lifting techniques 50# or greater with no increase in pain.  (MET 10/16)     Plan:  Pt has met goals for therapy and will be d/c'd to HEP per PT POC.    Total Session Time 30 and Timed code minutes 30  THERAPEUTIC EXERCISE 30 minutes and JOINT MOBILIZATION/MFR 0 minutes      Leah Bragg, PTA 10/23/2022 08:15  William Hamburger, PT 10/24/2022 15:59
